# Patient Record
Sex: Female | Born: 2000 | Hispanic: No | Marital: Single | Smoking: Never smoker
Health system: Southern US, Community
[De-identification: ages and names within clinical notes are randomized; demographics above are authoritative.]

## PROBLEM LIST (undated history)

## (undated) DIAGNOSIS — G43909 Migraine, unspecified, not intractable, without status migrainosus: Secondary | ICD-10-CM

---

## 2021-04-03 ENCOUNTER — Other Ambulatory Visit: Payer: Self-pay

## 2021-04-03 ENCOUNTER — Encounter (HOSPITAL_BASED_OUTPATIENT_CLINIC_OR_DEPARTMENT_OTHER): Payer: Self-pay | Admitting: *Deleted

## 2021-04-03 ENCOUNTER — Emergency Department (HOSPITAL_BASED_OUTPATIENT_CLINIC_OR_DEPARTMENT_OTHER)
Admission: EM | Admit: 2021-04-03 | Discharge: 2021-04-04 | Disposition: A | Discharge status: Home / Self Care | Payer: 59 | Attending: Emergency Medicine | Admitting: Emergency Medicine

## 2021-04-03 DIAGNOSIS — G43909 Migraine, unspecified, not intractable, without status migrainosus: Secondary | ICD-10-CM | POA: Insufficient documentation

## 2021-04-03 DIAGNOSIS — G43809 Other migraine, not intractable, without status migrainosus: Secondary | ICD-10-CM

## 2021-04-03 HISTORY — DX: Migraine, unspecified, not intractable, without status migrainosus: G43.909

## 2021-04-03 LAB — PREGNANCY, URINE: Preg Test, Ur: NEGATIVE

## 2021-04-03 NOTE — ED Triage Notes (Signed)
Hx of migraine. Reports having them "all week". She has taken tylenol today. States she has been put on other mha medications but "they don't work". Voices that she has never had a CT to help diagnose what is causing her headaches. Denies n/v. Endorses photosensitivity

## 2021-04-03 NOTE — ED Notes (Signed)
Has urine cup, will ask again when pt is back in a room.

## 2021-04-04 ENCOUNTER — Emergency Department (HOSPITAL_BASED_OUTPATIENT_CLINIC_OR_DEPARTMENT_OTHER): Payer: 59

## 2021-04-04 ENCOUNTER — Encounter (HOSPITAL_BASED_OUTPATIENT_CLINIC_OR_DEPARTMENT_OTHER): Payer: Self-pay | Admitting: Emergency Medicine

## 2021-04-04 MED ORDER — DIPHENHYDRAMINE HCL 50 MG/ML IJ SOLN
12.5000 mg | Freq: Once | INTRAMUSCULAR | Status: AC
  Administered 2021-04-04: 12.5 mg via INTRAVENOUS
  Filled 2021-04-04: qty 1

## 2021-04-04 MED ORDER — METOCLOPRAMIDE HCL 5 MG/ML IJ SOLN
10.0000 mg | Freq: Once | INTRAMUSCULAR | Status: AC
  Administered 2021-04-04: 10 mg via INTRAVENOUS
  Filled 2021-04-04: qty 2

## 2021-04-04 MED ORDER — KETOROLAC TROMETHAMINE 30 MG/ML IJ SOLN
30.0000 mg | Freq: Once | INTRAMUSCULAR | Status: AC
  Administered 2021-04-04: 30 mg via INTRAVENOUS
  Filled 2021-04-04: qty 1

## 2021-04-04 MED ORDER — MAGNESIUM SULFATE 2 GM/50ML IV SOLN
2.0000 g | Freq: Once | INTRAVENOUS | Status: AC
  Administered 2021-04-04: 2 g via INTRAVENOUS
  Filled 2021-04-04: qty 50

## 2021-04-04 NOTE — ED Provider Notes (Signed)
[facility] EMERGENCY DEPARTMENT Provider Note   CSN: [id number] Arrival date & time: 04/03/21  2043     History  Chief Complaint  Patient presents with   Migraine    Brenda Ayers is a 21 y.o. female.  The history is provided by the patient.  Migraine This is a recurrent problem. The current episode started more than 1 week ago. The problem occurs constantly. The problem has not changed since onset.Pertinent negatives include no chest pain, no abdominal pain, no headaches and no shortness of breath. Nothing aggravates the symptoms. Nothing relieves the symptoms. She has tried nothing for the symptoms. The treatment provided no relief.  Patient with known frequent migraines who is followed by [facility] who presents with a week of migraine.  She has known migraines and is supposed to be on suppressive medication for migraine and break through medication but is not taking them.  She does not know what brought this migraine on.  She denies fevers, neck pain, changes in vision or speech or weakness.    She states [facility] has never done imaging to explain her headaches.    Home Medications Prior to Admission medications   Not on File      Allergies    Patient has no known allergies.    Review of Systems   Review of Systems  Constitutional:  Negative for fever.  HENT:  Negative for congestion.   Eyes:  Negative for redness and visual disturbance.  Respiratory:  Negative for shortness of breath.   Cardiovascular:  Negative for chest pain.  Gastrointestinal:  Negative for abdominal pain and vomiting.  Genitourinary:  Negative for difficulty urinating.  Musculoskeletal:  Negative for neck stiffness.  Skin:  Negative for rash.  Neurological:  Negative for facial asymmetry and headaches.  Psychiatric/Behavioral:  Negative for confusion.   All other systems reviewed and are negative.  Physical Exam Updated Vital Signs BP (!) 132/92    Pulse (!) 106    Temp 98.6 F (37 C) (Oral)     Resp 15    Ht 4\' 11"  (1.499 m)    Wt 72.1 kg    LMP 04/01/2021    SpO2 99%    BMI 32.11 kg/m  Physical Exam Vitals and nursing note reviewed. Exam conducted with a chaperone present.  Constitutional:      General: She is not in acute distress.    Appearance: Normal appearance.  HENT:     Head: Normocephalic and atraumatic.     Nose: Nose normal.     Mouth/Throat:     Mouth: Mucous membranes are moist.  Eyes:     Extraocular Movements: Extraocular movements intact.     Conjunctiva/sclera: Conjunctivae normal.     Pupils: Pupils are equal, round, and reactive to light.     Comments: No proptosis sharp disk margins  Cardiovascular:     Rate and Rhythm: Normal rate and regular rhythm.     Pulses: Normal pulses.     Heart sounds: Normal heart sounds.  Pulmonary:     Effort: Pulmonary effort is normal.     Breath sounds: Normal breath sounds.  Abdominal:     General: Abdomen is flat. Bowel sounds are normal.     Tenderness: There is no abdominal tenderness. There is no guarding.  Musculoskeletal:     Cervical back: Normal range of motion and neck supple. No rigidity.  Lymphadenopathy:     Cervical: No cervical adenopathy.  Neurological:     General:  No focal deficit present.     Mental Status: She is alert and oriented to person, place, and time.     Cranial Nerves: No cranial nerve deficit.     Deep Tendon Reflexes: Reflexes normal.  Psychiatric:        Mood and Affect: Mood normal.        Behavior: Behavior normal.    ED Results / Procedures / Treatments   Labs (all labs ordered are listed, but only abnormal results are displayed) Labs Reviewed  PREGNANCY, URINE    EKG None  Radiology CT Head Wo Contrast  Result Date: 04/04/2021 CLINICAL DATA:  Headaches EXAM: CT HEAD WITHOUT CONTRAST TECHNIQUE: Contiguous axial images were obtained from the base of the skull through the vertex without intravenous contrast. RADIATION DOSE REDUCTION: This exam was performed  according to the departmental dose-optimization program which includes automated exposure control, adjustment of the mA and/or kV according to patient size and/or use of iterative reconstruction technique. COMPARISON:  None. FINDINGS: Brain: No evidence of acute infarction, hemorrhage, hydrocephalus, extra-axial collection or mass lesion/mass effect. Vascular: No hyperdense vessel or unexpected calcification. Skull: Normal. Negative for fracture or focal lesion. Sinuses/Orbits: No acute finding. Other: None. IMPRESSION: No acute intracranial abnormality noted. Electronically Signed   By: Alcide Clever M.D.   On: 04/04/2021 00:19    Procedures Procedures    Medications Ordered in ED Medications  magnesium sulfate IVPB 2 g 50 mL (2 g Intravenous New Bag/Given 04/04/21 0050)  ketorolac (TORADOL) 30 MG/ML injection 30 mg (30 mg Intravenous Given 04/04/21 0045)  metoCLOPramide (REGLAN) injection 10 mg (10 mg Intravenous Given 04/04/21 0047)  diphenhydrAMINE (BENADRYL) injection 12.5 mg (12.5 mg Intravenous Given 04/04/21 0044)    ED Course/ Medical Decision Making/ A&P                           Medical Decision Making Patient with migraine.  No atypical features.  Off all medications.  No f/c/r   Amount and/or Complexity of Data Reviewed External Data Reviewed: notes.    Details: multiple neurology notes from [facility] reviewed Labs: ordered.    Details: pregnancy test reviewed by me and is negative Radiology: ordered and independent interpretation performed.    Details: CT head reviewed by me and is normal  Risk Prescription drug management. Risk Details: No ICH or space occupying lesion on CT scan.  No sigsn of meningitis on history or physical exam.  Patient's symptoms  are consistent with chronic migraine with exacerbation.  Resolved with migraine cocktail.  Follow up with [facility] for ongoing care.     Final Clinical Impression(s) / ED Diagnoses Final diagnoses:  None   Return for intractable  cough, coughing up blood, fevers > 100.4 unrelieved by medication, shortness of breath, intractable vomiting, chest pain, shortness of breath, weakness, numbness, changes in speech, facial asymmetry, abdominal pain, passing out, Inability to tolerate liquids or food, cough, altered mental status or any concerns. No signs of systemic illness or infection. The patient is nontoxic-appearing on exam and vital signs are within normal limits.  I have reviewed the triage vital signs and the nursing notes. Pertinent labs & imaging results that were available during my care of the patient were reviewed by me and considered in my medical decision making (see chart for details). After history, exam, and medical workup I feel the patient has been appropriately medically screened and is safe for discharge home. Pertinent diagnoses were discussed with  the patient. Patient was given return precautions.   Rx / DC Orders ED Discharge Orders     None         Wadie Liew, MD 04/04/21 1884

## 2022-03-02 ENCOUNTER — Emergency Department
Admission: EM | Admit: 2022-03-02 | Discharge: 2022-03-02 | Disposition: A | Discharge status: Home / Self Care | Payer: Self-pay | Attending: Emergency Medicine | Admitting: Emergency Medicine

## 2022-03-02 ENCOUNTER — Other Ambulatory Visit: Payer: Self-pay

## 2022-03-02 DIAGNOSIS — Z5321 Procedure and treatment not carried out due to patient leaving prior to being seen by health care provider: Secondary | ICD-10-CM | POA: Insufficient documentation

## 2022-03-02 DIAGNOSIS — G43909 Migraine, unspecified, not intractable, without status migrainosus: Secondary | ICD-10-CM | POA: Insufficient documentation

## 2022-03-02 NOTE — ED Triage Notes (Signed)
Pt sent from her doctor's office with a migraine. Pt received a Toradol shot that did not work and was told to come here. Pt has had migraine x3 days.

## 2023-03-19 IMAGING — CT CT HEAD W/O CM
3 series · 16 of 47 positions shown, 19 images · non-contrast
Comparison: None.

CLINICAL DATA: Headaches



[Series 2: head wo · axial · 0.44mm/px · z∈[-86,+38]mm · 10 of 31 slices shown, 13 images]
[im 3/31  brain]
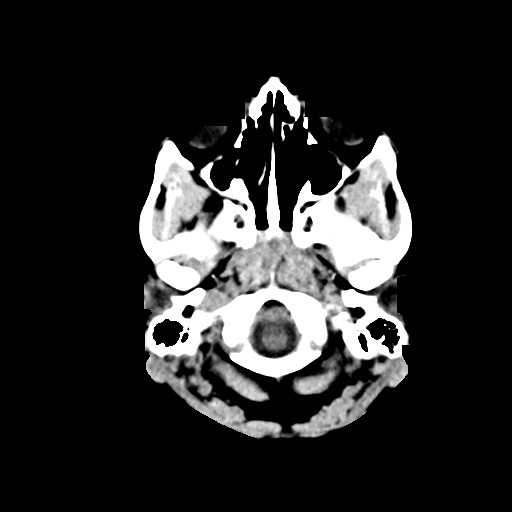
[im 3/31  bone]
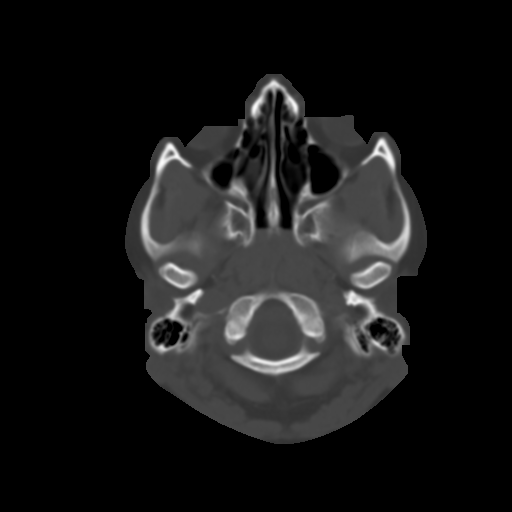
[im 6/31  brain]
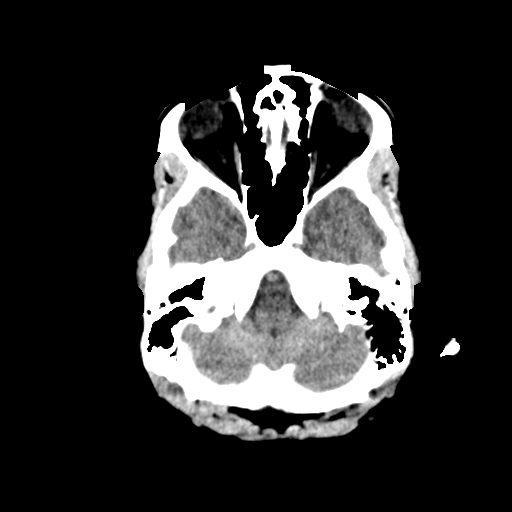
[im 9/31  brain]
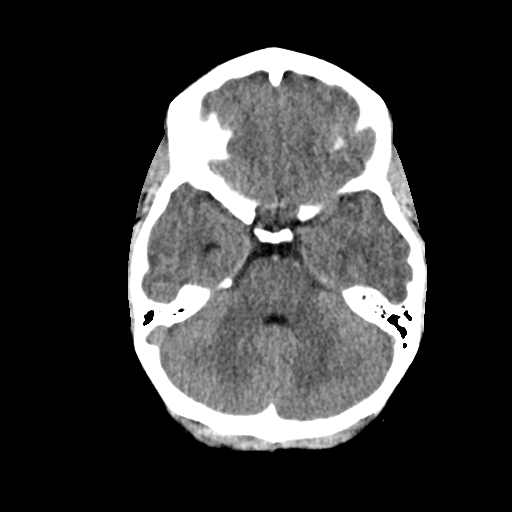
[im 11/31  brain]
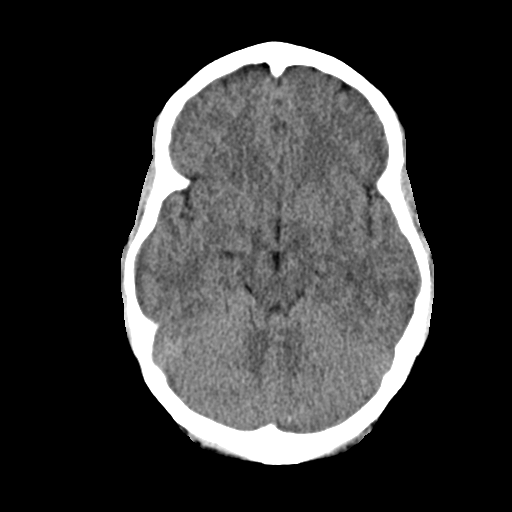
[im 14/31  brain]
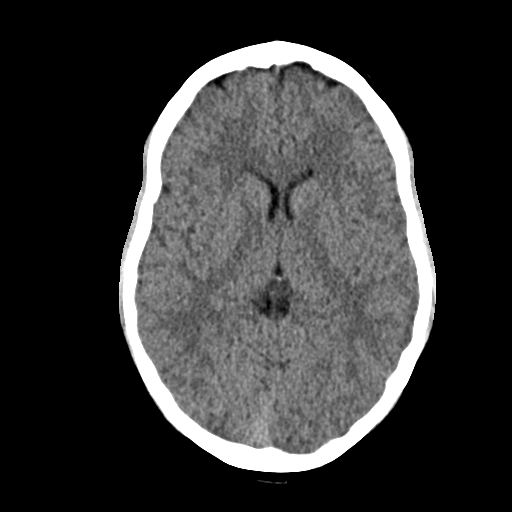
[im 14/31  bone]
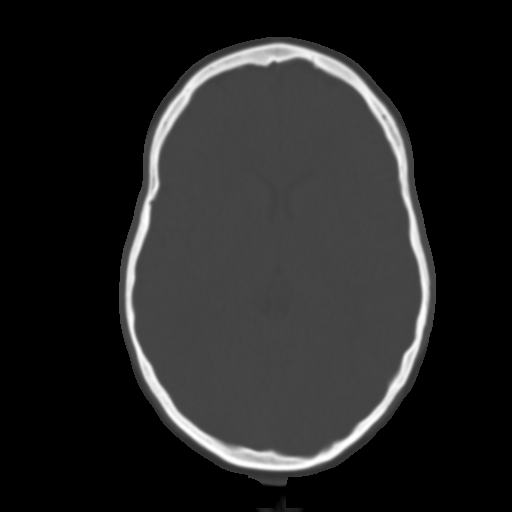
[im 17/31  brain]
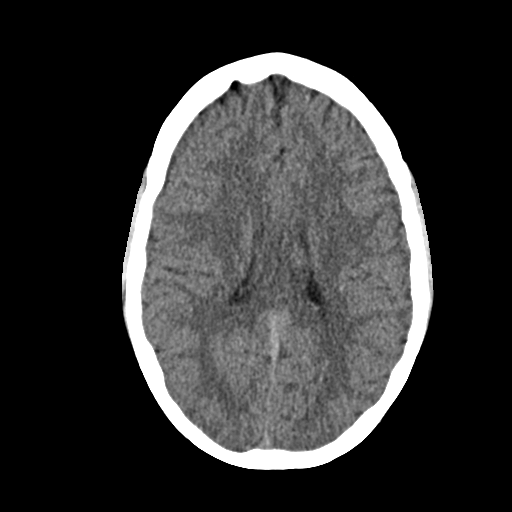
[im 20/31  brain]
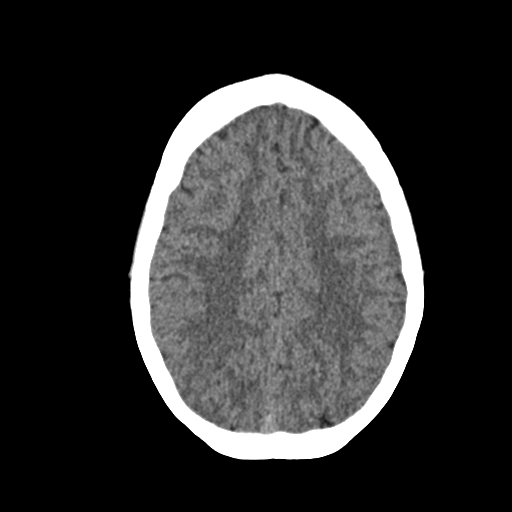
[im 23/31  brain]
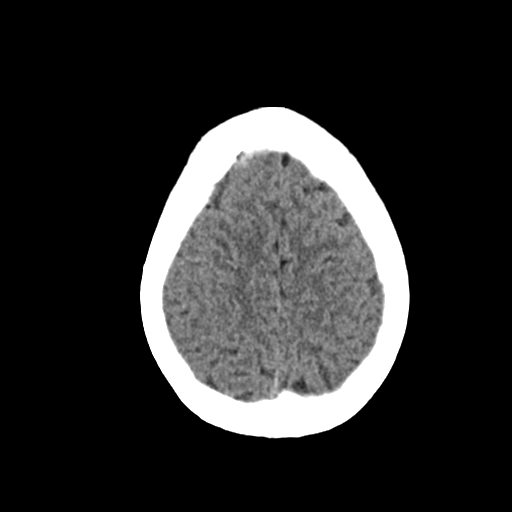
[im 25/31  brain]
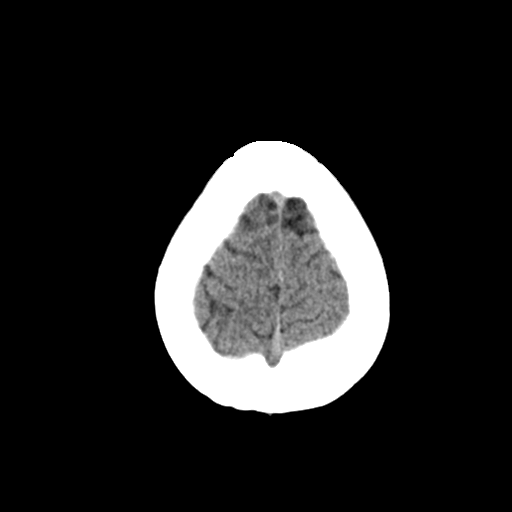
[im 25/31  bone]
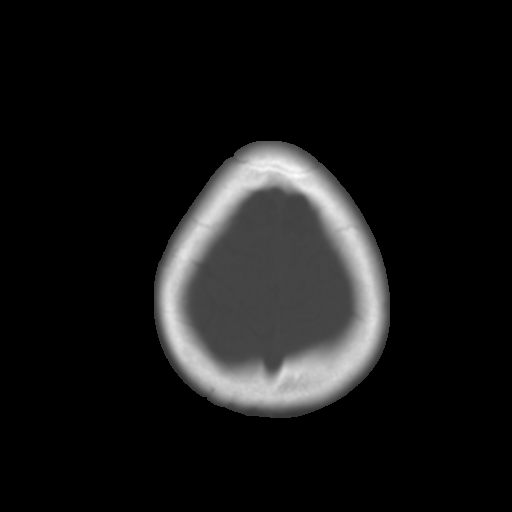
[im 28/31  brain]
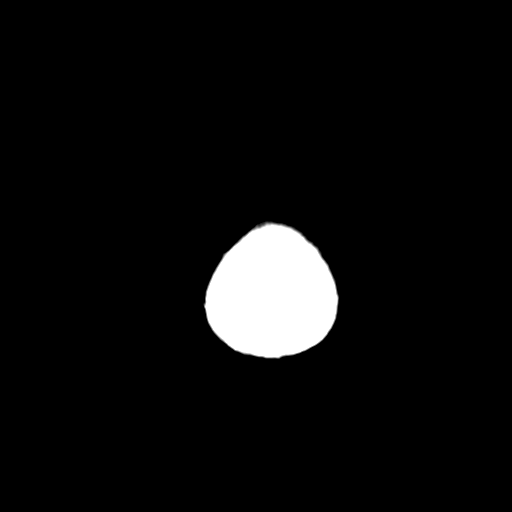

[Series 4: coronal soft · coronal · 0.33mm/px · 3 of 68 slices shown]
[im 23/68  brain]
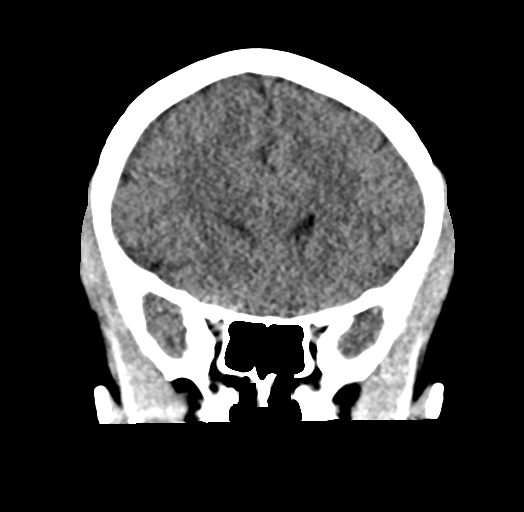
[im 30/68  brain]
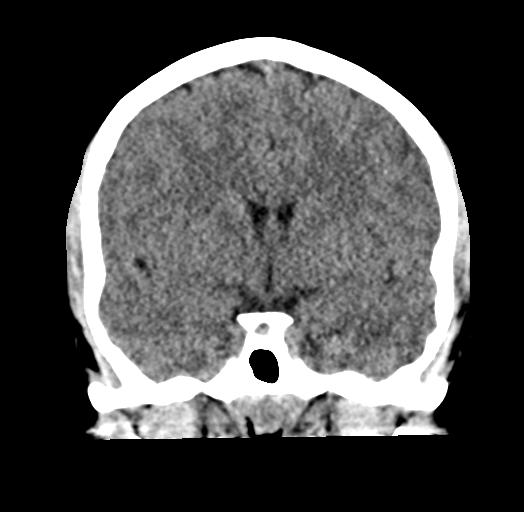
[im 38/68  brain]
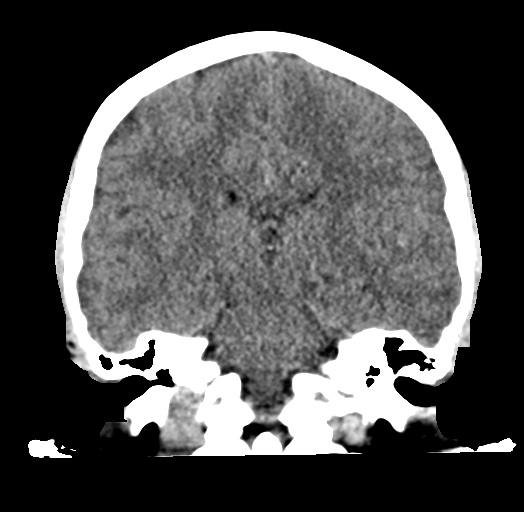

[Series 5: sag soft · sagittal · 0.35mm/px · 3 of 57 slices shown]
[im 19/57  brain]
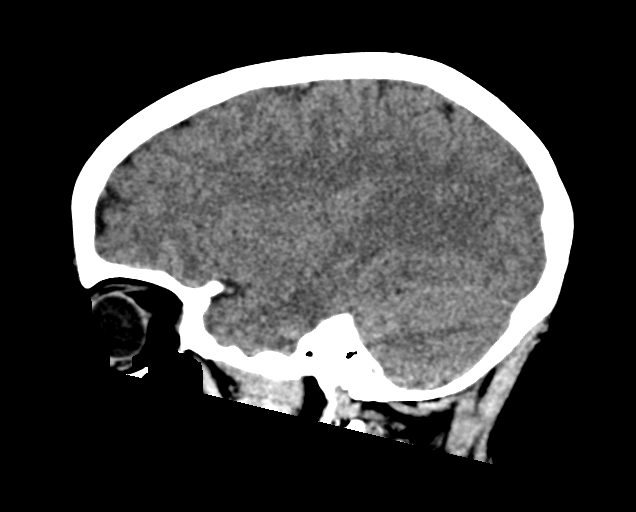
[im 29/57  brain]
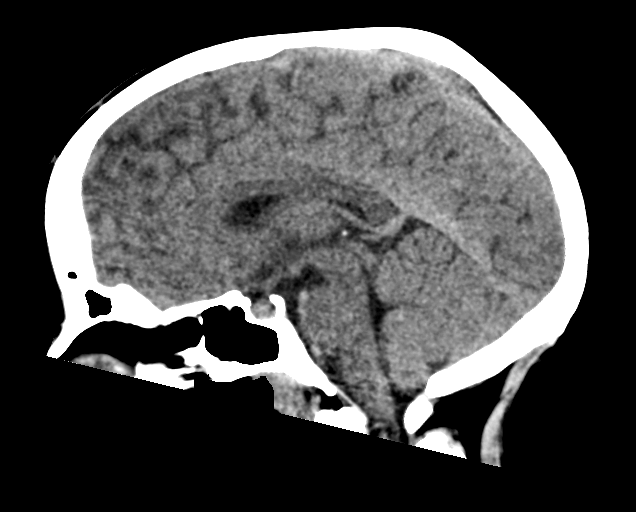
[im 38/57  brain]
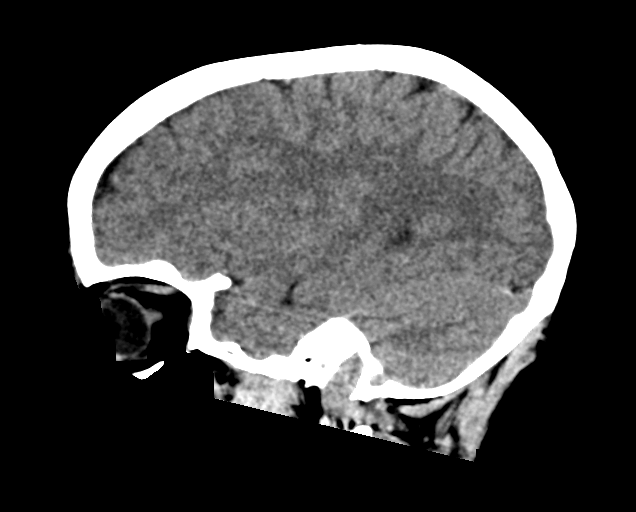

[16 of 47 positions shown; findings below may reference images not displayed]

FINDINGS: Brain: No evidence of acute infarction, hemorrhage, hydrocephalus,
extra-axial collection or mass lesion/mass effect.

Vascular: No hyperdense vessel or unexpected calcification.

Skull: Normal. Negative for fracture or focal lesion.

Sinuses/Orbits: No acute finding.

Other: None.
IMPRESSION: No acute intracranial abnormality noted.

## 2023-07-14 ENCOUNTER — Encounter: Payer: Self-pay | Admitting: *Deleted

## 2023-07-14 ENCOUNTER — Emergency Department
Admission: EM | Admit: 2023-07-14 | Discharge: 2023-07-14 | Disposition: A | Discharge status: Home / Self Care | Payer: Self-pay

## 2023-07-14 ENCOUNTER — Other Ambulatory Visit: Payer: Self-pay

## 2023-07-14 DIAGNOSIS — K0889 Other specified disorders of teeth and supporting structures: Secondary | ICD-10-CM | POA: Insufficient documentation

## 2023-07-14 MED ORDER — LIDOCAINE VISCOUS HCL 2 % MT SOLN: 15.0000 mL | OROMUCOSAL | 0 refills | Status: DC | PRN

## 2023-07-14 MED ORDER — LIDOCAINE VISCOUS HCL 2 % MT SOLN
15.0000 mL | Freq: Once | OROMUCOSAL | Status: AC
  Administered 2023-07-14: 15 mL via OROMUCOSAL
  Filled 2023-07-14: qty 15

## 2023-07-14 MED ORDER — LIDOCAINE VISCOUS HCL 2 % MT SOLN: 15.0000 mL | OROMUCOSAL | 0 refills | Status: AC | PRN

## 2023-07-14 NOTE — ED Triage Notes (Signed)
 Pt ambulatory to triage.  Pt has right upper toothache.  Pt started abx today.  Pt continues to have pain after taking otc meds without pain relief.  Pt alert.

## 2023-07-14 NOTE — ED Provider Notes (Signed)
 [facility] Provider Note    Event Date/Time   First MD Initiated Contact with Patient 07/14/23 1947     (approximate)   History   Dental Pain  Pt ambulatory to triage.  Pt has right upper toothache.  Pt started abx today.  Pt continues to have pain after taking otc meds without pain relief.  Pt alert.    HPI Brenda Ayers is a 23 y.o. female no significant past medical history presents for evaluation of toothache - Right upper teeth, present for the past 2 days.  Called dentist today, unable to get seen in clinic but they did prescribe her oral amoxicillin which she started taking today.  Has been using Tylenol and Motrin with minimal relief. -No fever, facial swelling, neck swelling, difficulty breathing     Physical Exam   Triage Vital Signs: ED Triage Vitals  Encounter Vitals Group     BP 07/14/23 1925 (!) 134/94     Systolic BP Percentile --      Diastolic BP Percentile --      Pulse Rate 07/14/23 1925 (!) 106     Resp 07/14/23 1925 17     Temp 07/14/23 1925 98.1 F (36.7 C)     Temp Source 07/14/23 1925 Oral     SpO2 07/14/23 1925 100 %     Weight 07/14/23 1927 154 lb (69.9 kg)     Height 07/14/23 1927 4\' 11"  (1.499 m)     Head Circumference --      Peak Flow --      Pain Score 07/14/23 1927 10     Pain Loc --      Pain Education --      Exclude from Growth Chart --     Most recent vital signs: Vitals:   07/14/23 1925  BP: (!) 134/94  Pulse: (!) 106  Resp: 17  Temp: 98.1 F (36.7 C)  SpO2: 100%     General: Awake, no distress.  HEENT: No dental caries appreciated, mild tenderness at base of 4th and 5th tooth.  No periapical abscess appreciated.  No facial or submandibular / neck swelling. CV:  Good peripheral perfusion Resp:  Normal effort.    ED Results / Procedures / Treatments   Labs (all labs ordered are listed, but only abnormal results are displayed) Labs Reviewed - No data to  display   EKG  N/a   RADIOLOGY N/a    PROCEDURES:  Critical Care performed: No  Procedures   MEDICATIONS ORDERED IN ED: Medications  lidocaine (XYLOCAINE) 2 % viscous mouth solution 15 mL (has no administration in time range)     IMPRESSION / MDM / ASSESSMENT AND PLAN / ED COURSE  I reviewed the triage vital signs and the nursing notes.                              DDX/MDM/AP: Differential diagnosis includes, but is not limited to, small early mild dental infection, no evidence of tooth fracture or loose tooth.  No evidence of abscess, no clinical concern for deep facial/neck space infection.  Plan: - Continue amoxicillin as already prescribed - Continue Tylenol, Motrin - Will also give viscous lidocaine - Plan for dental follow-up - Return precautions  Patient's presentation is most consistent with acute, uncomplicated illness.       FINAL CLINICAL IMPRESSION(S) / ED DIAGNOSES   Final diagnoses:  Pain, dental  Rx / DC Orders   ED Discharge Orders          Ordered    lidocaine (XYLOCAINE) 2 % solution  As needed        07/14/23 2006             Note:  This document was prepared using Dragon voice recognition software and may include unintentional dictation errors.   Collis Deaner, MD 07/14/23 2012

## 2023-07-14 NOTE — ED Notes (Signed)
 Pt verbalizes understanding of discharge instructions.

## 2023-07-14 NOTE — Discharge Instructions (Signed)
 Your evaluation in the emergency department was reassuring.  Continue your antibiotics as already prescribed, you can continue to use Tylenol and Motrin as directed on the bottle.  In addition, I have prescribed you a topical numbing medication that you can use to help with your symptoms.  Please do follow-up with your dentist, and return to the emergency department with any new or worsening symptoms.
